# Patient Record
Sex: Female | Born: 2000 | Race: White | Hispanic: No | Marital: Single | Smoking: Never smoker
Health system: Southern US, Community
[De-identification: ages and names within clinical notes are randomized; demographics above are authoritative.]

---
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
## 2022-03-29 IMAGING — CR DG ELBOW COMPLETE 3+V*L*
4 series · 4 of 4 positions shown · non-contrast
Comparison: None.

CLINICAL DATA: Pain after fall

EXAM:
LEFT ELBOW - COMPLETE 3+ VIEW

[x elbow ap left]
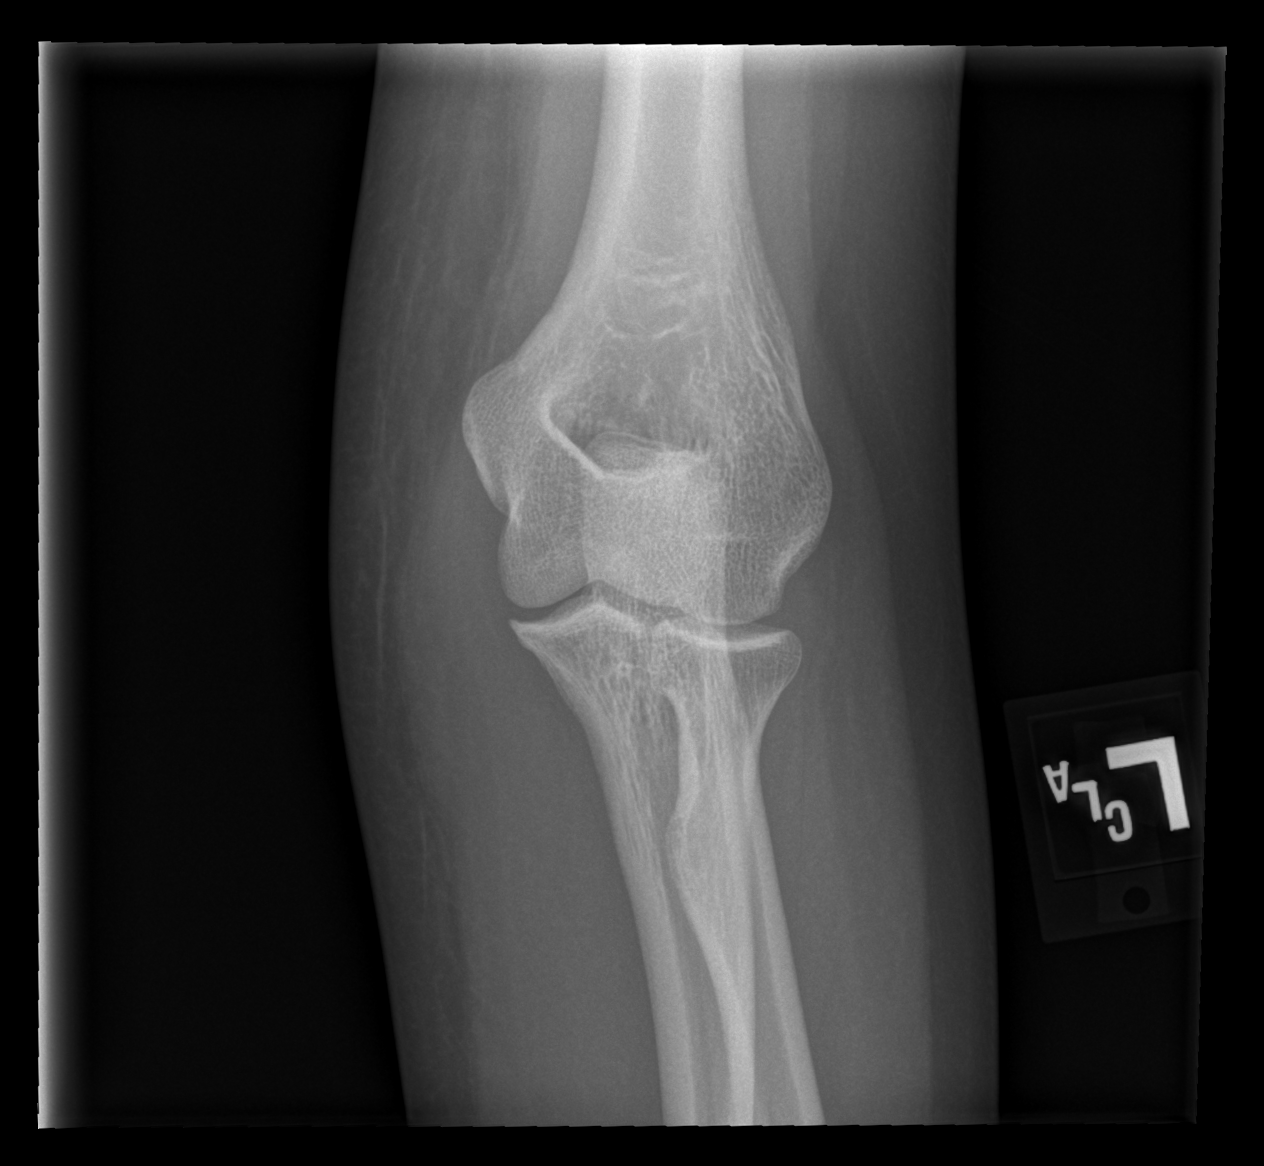

[x elbow obl left (1 of 2)]
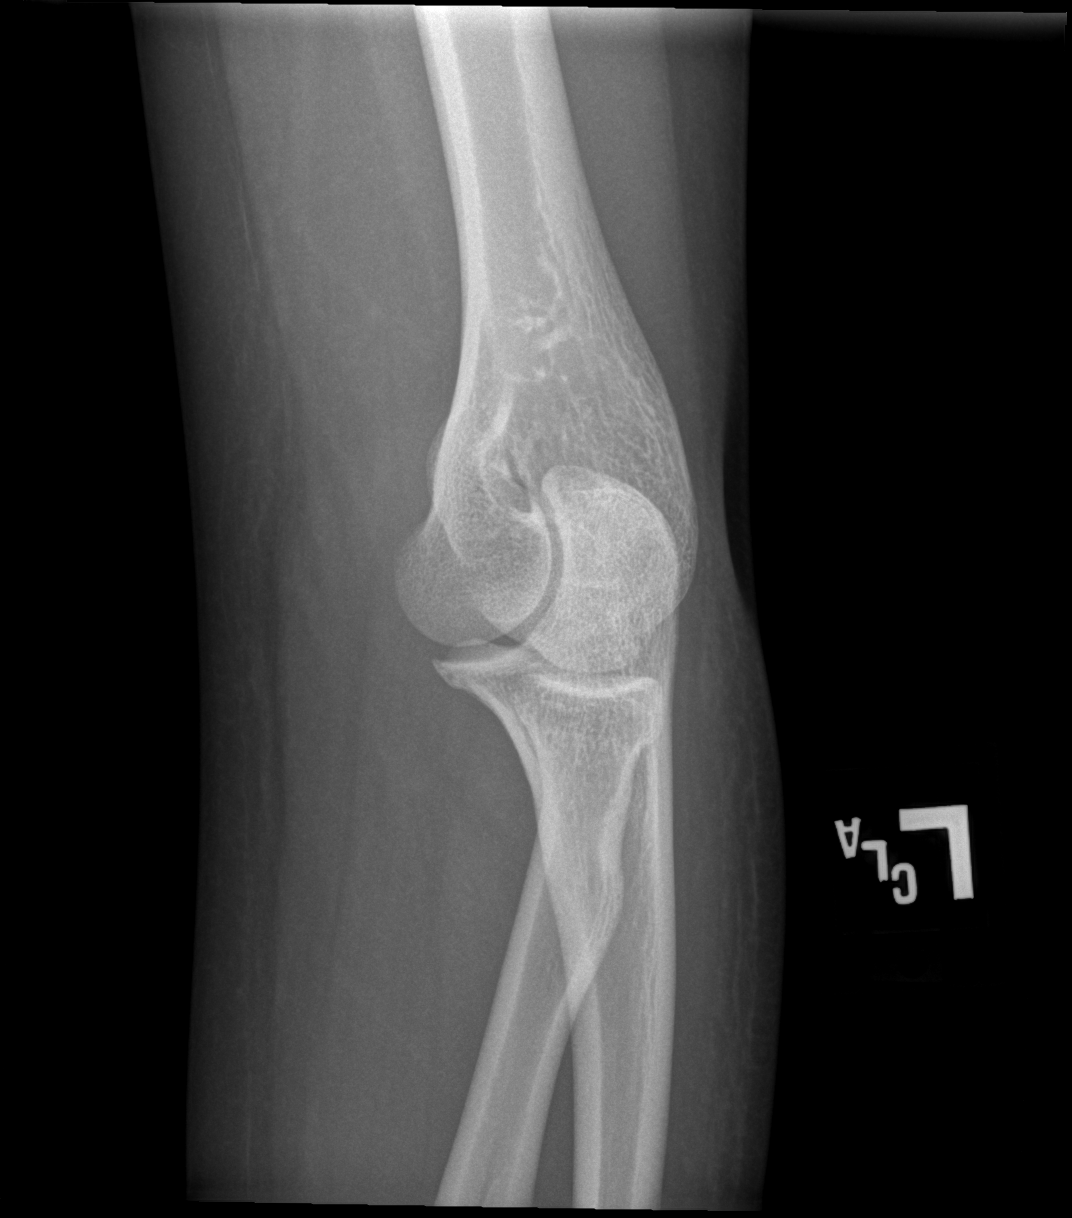

[x elbow obl left (2 of 2)]
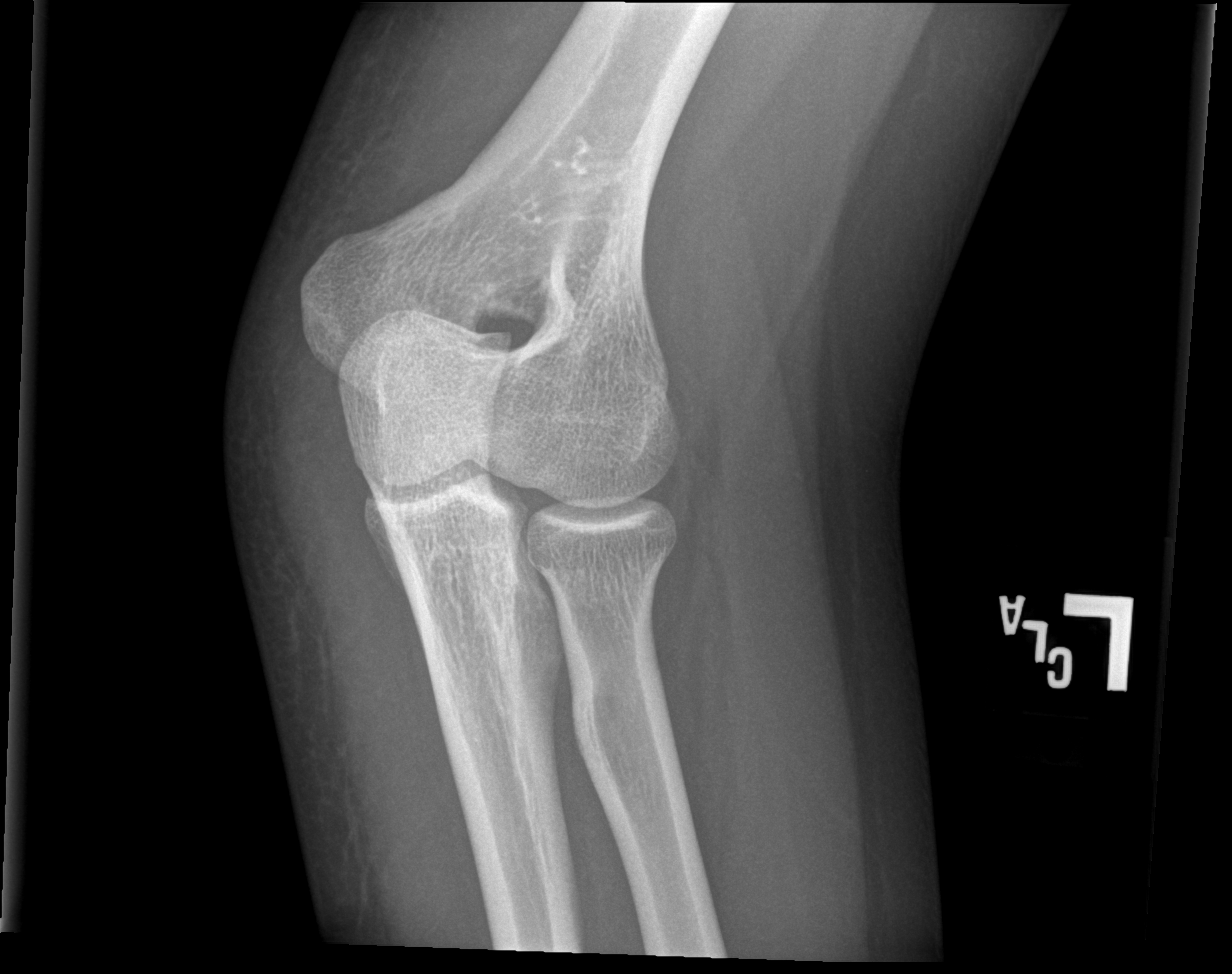

[x elbow lat left]
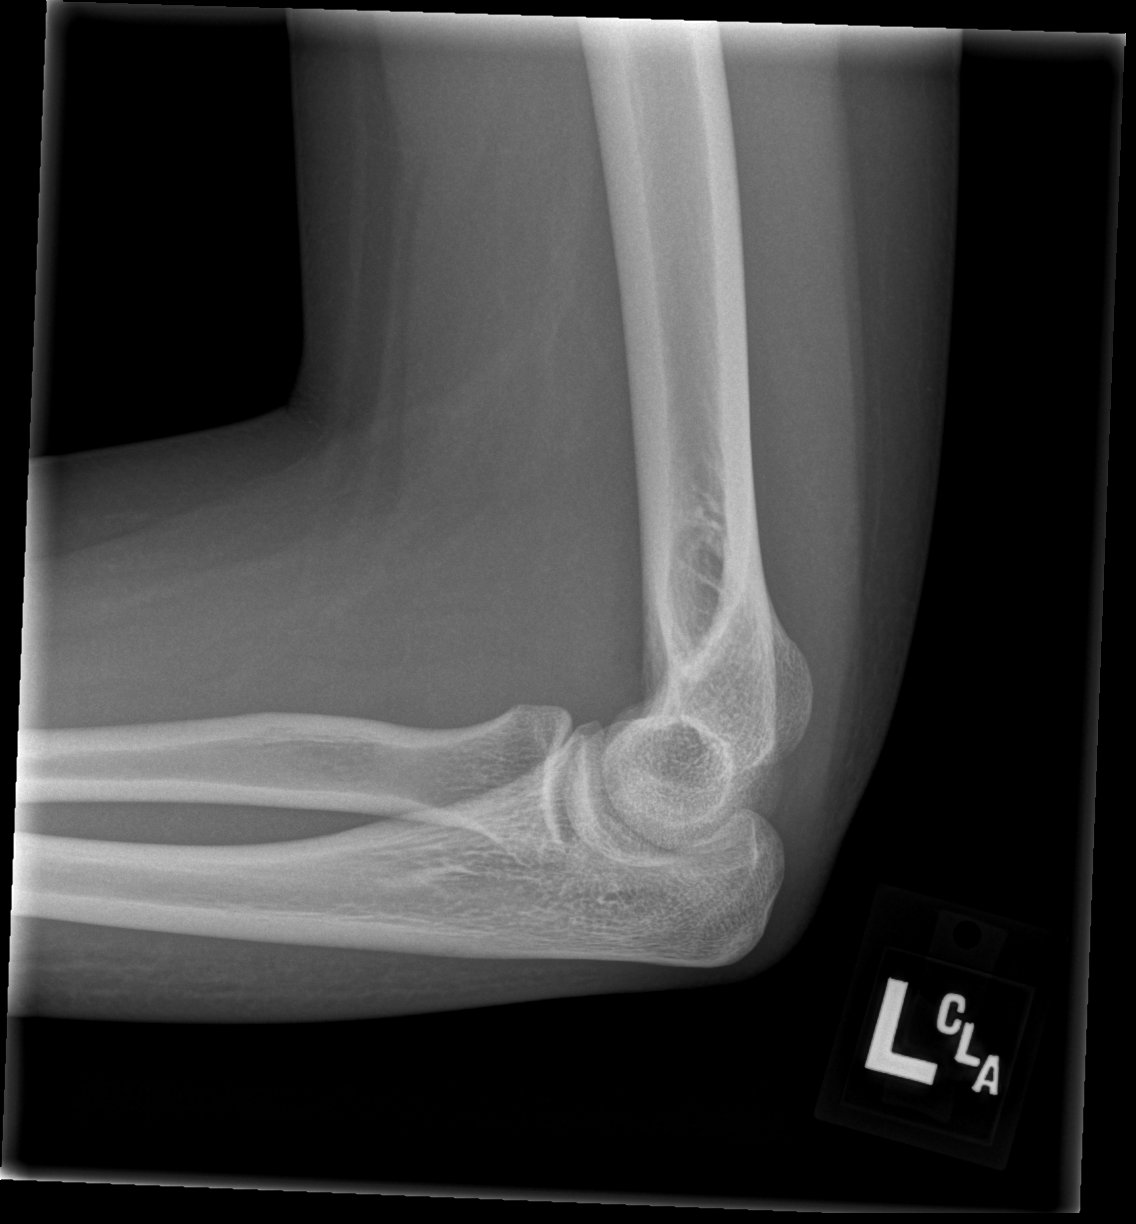

[4 of 4 positions shown; findings below may reference images not displayed]

FINDINGS: There is no evidence of fracture, dislocation, or joint effusion.
There is no evidence of arthropathy or other focal bone abnormality.
Soft tissues are unremarkable.
IMPRESSION: Negative.

## 2022-11-30 DIAGNOSIS — J069 Acute upper respiratory infection, unspecified: Secondary | ICD-10-CM | POA: Diagnosis not present

## 2022-11-30 DIAGNOSIS — R519 Headache, unspecified: Secondary | ICD-10-CM | POA: Diagnosis not present

## 2022-11-30 DIAGNOSIS — R110 Nausea: Secondary | ICD-10-CM | POA: Diagnosis not present

## 2023-06-23 DIAGNOSIS — R300 Dysuria: Secondary | ICD-10-CM | POA: Diagnosis not present

## 2023-06-23 DIAGNOSIS — R109 Unspecified abdominal pain: Secondary | ICD-10-CM | POA: Diagnosis not present

## 2023-06-23 DIAGNOSIS — R509 Fever, unspecified: Secondary | ICD-10-CM | POA: Diagnosis not present

## 2023-06-23 DIAGNOSIS — Z113 Encounter for screening for infections with a predominantly sexual mode of transmission: Secondary | ICD-10-CM | POA: Diagnosis not present

## 2023-08-25 ENCOUNTER — Ambulatory Visit (INDEPENDENT_AMBULATORY_CARE_PROVIDER_SITE_OTHER): Admitting: Diagnostic Neuroimaging

## 2023-08-25 ENCOUNTER — Encounter: Payer: Self-pay | Admitting: Diagnostic Neuroimaging

## 2023-08-25 VITALS — Ht 67.0 in | Wt 135.0 lb

## 2023-08-25 DIAGNOSIS — R55 Syncope and collapse: Secondary | ICD-10-CM

## 2023-08-25 DIAGNOSIS — Z8249 Family history of ischemic heart disease and other diseases of the circulatory system: Secondary | ICD-10-CM

## 2023-08-25 NOTE — Progress Notes (Signed)
 [facility]  PATIENT: Deborah Pearson DOB: April 24, 2000  REFERRING CLINICIAN: Delta Fila, NP HISTORY FROM: patient  REASON FOR VISIT: new consult   HISTORICAL  CHIEF COMPLAINT:  Chief Complaint  Patient presents with   Loss of Consciousness    Rm 7 alone Pt is well, reports she has loss consciousness about 3-4 times. Last episode was in Nov. No onset triggers she is ware, episodes will happen randomly but she has noticed it more during positional changes when going from sitting to standing.     HISTORY OF PRESENT ILLNESS:   23 year old female here for evaluation of recurrent syncope events and family history of aneurysm.  Patient has had 4 episodes of provoked syncope since middle school.  Typically these were occur in the setting of dehydration, overheating or infection.  First episode occurred in middle school.  3 episodes of occurred since 2022.  Last event May 2024.  All of these have had prodromal lightheadedness, dizziness, nausea, seeing spots, tunnel vision.  Patient's vision would go dark and she would collapse.  Sometimes she could still hear during the event.  She would wake up fairly quickly without any significant confusion.  Patient also has family history of cerebral aneurysm in father who passed away of years ago.  She was concerned about this family history of want to get some evaluation.   REVIEW OF SYSTEMS: Full 14 system review of systems performed and negative with exception of: as per HPI.  ALLERGIES: No Known Allergies  HOME MEDICATIONS: Outpatient Medications Prior to Visit  Medication Sig Dispense Refill   acetaminophen (TYLENOL) 500 MG tablet Take 500 mg by mouth every 6 (six) hours as needed for moderate pain, mild pain or fever. (Patient not taking: Reported on 08/25/2023)     ibuprofen (ADVIL) 200 MG tablet Take 200 mg by mouth every 6 (six) hours as needed for headache or moderate pain. (Patient not taking: Reported on 08/25/2023)      No facility-administered medications prior to visit.    PAST MEDICAL HISTORY: History reviewed. No pertinent past medical history.  PAST SURGICAL HISTORY: History reviewed. No pertinent surgical history.  FAMILY HISTORY: History reviewed. No pertinent family history.  SOCIAL HISTORY: Social History   Socioeconomic History   Marital status: Single    Spouse name: Not on file   Number of children: Not on file   Years of education: Not on file   Highest education level: Not on file  Occupational History   Not on file  Tobacco Use   Smoking status: Never   Smokeless tobacco: Never  Vaping Use   Vaping status: Every Day  Substance and Sexual Activity   Alcohol use: Never   Drug use: Never   Sexual activity: Not on file  Other Topics Concern   Not on file  Social History Narrative   2025- R handed, Lives alone, no children    Social Drivers of Corporate investment banker Strain: Not on file  Food Insecurity: Not on file  Transportation Needs: Not on file  Physical Activity: Not on file  Stress: Not on file  Social Connections: Not on file  Intimate Partner Violence: Not on file     PHYSICAL EXAM  GENERAL EXAM/CONSTITUTIONAL: Vitals:  Vitals:   08/25/23 1005  Weight: 135 lb (61.2 kg)  Height: 5\' 7"  (1.702 m)   Orthostatic VS for the past 24 hrs (Last 3 readings):  BP- Lying Pulse- Lying BP- Standing at 0 minutes Pulse- Standing at  0 minutes BP- Standing at 3 minutes Pulse- Standing at 3 minutes  08/25/23 1006 107/66 77 113/70 68 114/79 85     Body mass index is 21.14 kg/m. Wt Readings from Last 3 Encounters:  08/25/23 135 lb (61.2 kg)  07/10/20 144 lb (65.3 kg) (81%, Z= 0.86)*   * Growth percentiles are based on CDC (Girls, 2-20 Years) data.   Patient is in no distress; well developed, nourished and groomed; neck is supple  CARDIOVASCULAR: Examination of carotid arteries is normal; no carotid bruits Regular rate and rhythm, no  murmurs Examination of peripheral vascular system by observation and palpation is normal  EYES: Ophthalmoscopic exam of optic discs and posterior segments is normal; no papilledema or hemorrhages No results found.  MUSCULOSKELETAL: Gait, strength, tone, movements noted in Neurologic exam below  NEUROLOGIC: MENTAL STATUS:      No data to display         awake, alert, oriented to person, place and time recent and remote memory intact normal attention and concentration language fluent, comprehension intact, naming intact fund of knowledge appropriate  CRANIAL NERVE:  2nd - no papilledema on fundoscopic exam 2nd, 3rd, 4th, 6th - pupils equal and reactive to light, visual fields full to confrontation, extraocular muscles intact, no nystagmus 5th - facial sensation symmetric 7th - facial strength symmetric 8th - hearing intact 9th - palate elevates symmetrically, uvula midline 11th - shoulder shrug symmetric 12th - tongue protrusion midline  MOTOR:  normal bulk and tone, full strength in the BUE, BLE  SENSORY:  normal and symmetric to light touch, temperature, vibration  COORDINATION:  finger-nose-finger, fine finger movements normal  REFLEXES:  deep tendon reflexes present and symmetric  GAIT/STATION:  narrow based gait     DIAGNOSTIC DATA (LABS, IMAGING, TESTING) - I reviewed patient records, labs, notes, testing and imaging myself where available.  No results found for: "WBC", "HGB", "HCT", "MCV", "PLT" No results found for: "NA", "K", "CL", "CO2", "GLUCOSE", "BUN", "CREATININE", "CALCIUM", "PROT", "ALBUMIN", "AST", "ALT", "ALKPHOS", "BILITOT", "GFRNONAA", "GFRAA" No results found for: "CHOL", "HDL", "LDLCALC", "LDLDIRECT", "TRIG", "CHOLHDL" No results found for: "HGBA1C" No results found for: "VITAMINB12" No results found for: "TSH"     ASSESSMENT AND PLAN  23 y.o. year old female here with:   Dx:  1. Family history of brain aneurysm   2.  Syncope, unspecified syncope type     PLAN:  SYNCOPE x 3 (possibly related to dehydration, infx in the past; middle school event, and 3 events since 2022; last event May 2024) - prodromal lightheaded, dizzy, seeing spots; brief LOC; no post-ictal confusion  FAMILY HISTORY OF BRAIN ANEURYSM (father) - check CTA head  Orders Placed This Encounter  Procedures   CT ANGIO HEAD W OR WO CONTRAST   Return for pending if symptoms worsen or fail to improve, pending test results.    Omega Bible, MD 08/25/2023, 10:47 AM Certified in Neurology, Neurophysiology and Neuroimaging  [facility] [address] [place], [place] [address] [phone]

## 2023-08-25 NOTE — Patient Instructions (Signed)
  SYNCOPE x 3 (possibly related to dehydration, infx in the past; middle school event, and 3 events since 2022; last event May 2024) - prodromal lightheaded, dizzy, seeing spots; brief LOC; no post-ictal confusion  FAMILY HISTORY OF BRAIN ANEURYSM (father) - check CTA head

## 2023-08-26 ENCOUNTER — Encounter: Payer: Self-pay | Admitting: Diagnostic Neuroimaging

## 2023-08-28 ENCOUNTER — Ambulatory Visit
Admission: RE | Admit: 2023-08-28 | Discharge: 2023-08-28 | Disposition: A | Discharge status: Home / Self Care | Source: Ambulatory Visit | Attending: Diagnostic Neuroimaging | Admitting: Diagnostic Neuroimaging

## 2023-08-28 ENCOUNTER — Encounter: Payer: Self-pay | Admitting: Diagnostic Neuroimaging

## 2023-08-28 DIAGNOSIS — Z8249 Family history of ischemic heart disease and other diseases of the circulatory system: Secondary | ICD-10-CM | POA: Diagnosis not present

## 2023-08-28 DIAGNOSIS — R55 Syncope and collapse: Secondary | ICD-10-CM

## 2023-08-28 MED ORDER — IOPAMIDOL (ISOVUE-370) INJECTION 76%
75.0000 mL | Freq: Once | INTRAVENOUS | Status: AC | PRN
  Administered 2023-08-28: 75 mL via INTRAVENOUS

## 2023-09-24 ENCOUNTER — Ambulatory Visit: Payer: Self-pay | Admitting: Diagnostic Neuroimaging

## 2023-12-08 ENCOUNTER — Other Ambulatory Visit

## 2023-12-10 ENCOUNTER — Other Ambulatory Visit
